# Patient Record
Sex: Male | Born: 2000 | Race: White | Marital: Single
Health system: Northeastern US, Community
[De-identification: ages and names within clinical notes are randomized; demographics above are authoritative.]

---
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
## 2020-12-06 ENCOUNTER — Emergency Department: Payer: BLUE CROSS/BLUE SHIELD

## 2020-12-06 ENCOUNTER — Other Ambulatory Visit: Payer: Self-pay

## 2020-12-06 ENCOUNTER — Emergency Department
Admission: EM | Admit: 2020-12-06 | Discharge: 2020-12-06 | Disposition: A | Discharge status: Home / Self Care | Payer: BLUE CROSS/BLUE SHIELD | Attending: Emergency Medicine | Admitting: Emergency Medicine

## 2020-12-06 DIAGNOSIS — R1310 Dysphagia, unspecified: Secondary | ICD-10-CM | POA: Diagnosis present

## 2020-12-06 DIAGNOSIS — H9201 Otalgia, right ear: Secondary | ICD-10-CM | POA: Insufficient documentation

## 2020-12-06 DIAGNOSIS — J36 Peritonsillar abscess: Secondary | ICD-10-CM

## 2020-12-06 DIAGNOSIS — D72829 Elevated white blood cell count, unspecified: Secondary | ICD-10-CM | POA: Diagnosis not present

## 2020-12-06 LAB — COMPREHENSIVE METABOLIC PANEL
ALT: 12 U/L (ref 0–44)
AST: 20 U/L (ref 15–41)
Albumin: 4.1 g/dL (ref 3.5–5.0)
Alkaline Phosphatase: 60 U/L (ref 38–126)
Anion gap: 11 (ref 5–15)
BUN: 10 mg/dL (ref 6–20)
CO2: 24 mmol/L (ref 22–32)
Calcium: 9.4 mg/dL (ref 8.9–10.3)
Chloride: 100 mmol/L (ref 98–111)
Creatinine, Ser: 0.61 mg/dL (ref 0.61–1.24)
GFR, Estimated: >60 mL/min (ref >60)
Glucose, Bld: 106 mg/dL — ABNORMAL HIGH (ref 70–99)
Potassium: 3.6 mmol/L (ref 3.5–5.1)
Sodium: 135 mmol/L (ref 135–145)
Total Bilirubin: 1 mg/dL (ref 0.3–1.2)
Total Protein: 7.7 g/dL (ref 6.5–8.1)

## 2020-12-06 LAB — CBC WITH DIFFERENTIAL/PLATELET
Abs Immature Granulocytes: 0.07 10*3/uL (ref 0.00–0.07)
Basophils Absolute: 0.1 10*3/uL (ref 0.0–0.1)
Basophils Relative: 0 %
Eosinophils Absolute: 0 10*3/uL (ref 0.0–0.5)
Eosinophils Relative: 0 %
HCT: 45.1 % (ref 39.0–52.0)
Hemoglobin: 15.1 g/dL (ref 13.0–17.0)
Immature Granulocytes: 0 %
Lymphocytes Relative: 9 %
Lymphs Abs: 1.7 10*3/uL (ref 0.7–4.0)
MCH: 29.4 pg (ref 26.0–34.0)
MCHC: 33.5 g/dL (ref 30.0–36.0)
MCV: 87.7 fL (ref 80.0–100.0)
Monocytes Absolute: 2 10*3/uL — ABNORMAL HIGH (ref 0.1–1.0)
Monocytes Relative: 10 %
Neutro Abs: 15.1 10*3/uL — ABNORMAL HIGH (ref 1.7–7.7)
Neutrophils Relative %: 81 %
Platelets: 298 10*3/uL (ref 150–400)
RBC: 5.14 MIL/uL (ref 4.22–5.81)
RDW: 12.4 % (ref 11.5–15.5)
WBC: 18.8 10*3/uL — ABNORMAL HIGH (ref 4.0–10.5)
nRBC: 0 % (ref 0.0–0.2)

## 2020-12-06 LAB — GROUP A STREP BY PCR: Group A Strep by PCR: NOT DETECTED

## 2020-12-06 MED ORDER — PREDNISONE 10 MG PO TABS: ORAL_TABLET | ORAL | 0 refills | Status: AC

## 2020-12-06 MED ORDER — SODIUM CHLORIDE 0.9 % IV BOLUS
1000.0000 mL | Freq: Once | INTRAVENOUS | Status: AC
  Administered 2020-12-06: 1000 mL via INTRAVENOUS

## 2020-12-06 MED ORDER — DEXAMETHASONE SODIUM PHOSPHATE 10 MG/ML IJ SOLN
10.0000 mg | Freq: Once | INTRAMUSCULAR | Status: AC
  Administered 2020-12-06: 10 mg via INTRAVENOUS
  Filled 2020-12-06: qty 1

## 2020-12-06 MED ORDER — ONDANSETRON HCL 4 MG/2ML IJ SOLN: 4.0000 mg | Freq: Once | INTRAMUSCULAR | Status: AC

## 2020-12-06 MED ORDER — OXYCODONE-ACETAMINOPHEN 5-325 MG PO TABS: 1.0000 | ORAL_TABLET | Freq: Four times a day (QID) | ORAL | 0 refills | Status: AC | PRN

## 2020-12-06 MED ORDER — LIDOCAINE-EPINEPHRINE 1 %-1:100000 IJ SOLN
20.0000 mL | Freq: Once | INTRAMUSCULAR | Status: AC
  Administered 2020-12-06: 1 mL via INTRADERMAL
  Filled 2020-12-06: qty 1

## 2020-12-06 MED ORDER — IOHEXOL 300 MG/ML  SOLN
75.0000 mL | Freq: Once | INTRAMUSCULAR | Status: AC | PRN
  Administered 2020-12-06: 75 mL via INTRAVENOUS
  Filled 2020-12-06: qty 75

## 2020-12-06 MED ORDER — SODIUM CHLORIDE 0.9 % IV SOLN
3.0000 g | Freq: Once | INTRAVENOUS | Status: AC
  Administered 2020-12-06: 3 g via INTRAVENOUS
  Filled 2020-12-06: qty 8

## 2020-12-06 MED ORDER — MORPHINE SULFATE (PF) 4 MG/ML IV SOLN
4.0000 mg | Freq: Once | INTRAVENOUS | Status: AC
Start: 2020-12-06 — End: 2020-12-06
  Administered 2020-12-06: 4 mg via INTRAVENOUS
  Filled 2020-12-06: qty 1

## 2020-12-06 MED ORDER — ONDANSETRON HCL 4 MG/2ML IJ SOLN
INTRAMUSCULAR | Status: AC
  Administered 2020-12-06: 4 mg via INTRAVENOUS
  Filled 2020-12-06: qty 2

## 2020-12-06 MED ORDER — AMOXICILLIN-POT CLAVULANATE 875-125 MG PO TABS: 1.0000 | ORAL_TABLET | Freq: Two times a day (BID) | ORAL | 0 refills | Status: AC

## 2020-12-06 MED ORDER — HYDROMORPHONE HCL 1 MG/ML IJ SOLN
1.0000 mg | Freq: Once | INTRAMUSCULAR | Status: AC
  Administered 2020-12-06: 1 mg via INTRAVENOUS
  Filled 2020-12-06: qty 1

## 2020-12-06 MED ORDER — BENZOCAINE 20 % MT AERO
INHALATION_SPRAY | Freq: Once | OROMUCOSAL | Status: AC
  Administered 2020-12-06: 1 via OROMUCOSAL
  Filled 2020-12-06: qty 57

## 2020-12-06 NOTE — Discharge Instructions (Signed)
Please take the antibiotics and steroid taper as prescribed.  You have also been prescribed Percocet to use every 6 hours as needed for pain.  You can combine this with an additional 650 mg of Tylenol.  Please do not drive or operate heavy machinery on this medication.  Follow-up with ENT, call their office tomorrow to schedule follow-up on Wednesday or Thursday with Dr. Andee Poles.

## 2020-12-06 NOTE — ED Triage Notes (Signed)
Pt come with c/o right ear pain for few days. Pt denies any cough. Pt states some congestion.

## 2020-12-06 NOTE — ED Notes (Signed)
Pt reports thinks he ruptured his right ear drum flying. Pt reports severe pain

## 2020-12-06 NOTE — Consult Note (Signed)
..   Dan, [name] [id number] 2000/12/10 Dan Se, MD  Reason for Consult: peritonsillar abscess  HPI: 20 y.o. male presents with several day history of right sided throat and ear pain.  Initially thought his ear drum burst but then gradually developed worsening sore throat and difficulty tolerating secretions.  Presented to ER and underwent CT scan.  Initial had difficulty with swallowing but this has improved after Decadron.  He is currently getting Unasyn.  Allergies: No Known Allergies  ROS: Review of systems normal other than 12 systems except per HPI.  PMH: History reviewed. No pertinent past medical history.  FH: No family history on file.  SH:  Social History   Socioeconomic History  . Marital status: Single    Spouse name: Not on file  . Number of children: Not on file  . Years of education: Not on file  . Highest education level: Not on file  Occupational History  . Not on file  Tobacco Use  . Smoking status: Not on file  . Smokeless tobacco: Not on file  Substance and Sexual Activity  . Alcohol use: Not on file  . Drug use: Not on file  . Sexual activity: Not on file  Other Topics Concern  . Not on file  Social History Narrative  . Not on file   Social Determinants of Health   Financial Resource Strain: Not on file  Food Insecurity: Not on file  Transportation Needs: Not on file  Physical Activity: Not on file  Stress: Not on file  Social Connections: Not on file  Intimate Partner Violence: Not on file    PSH: History reviewed. No pertinent surgical history.  Physical  Exam:  GEN-  NAD, supine in bed NEURO- CN 2-12 grossly intact and symmetric. EARS-EAC/TMs normal BL. OC/OP-   Bilateral tonsillary edema and erythema with uvular deviation to patient's left.  Fluctuance and tenderness just lateral to superior right tonsil. NECK- reactive lymphadenopathy CARD-  RRR RESP-  CTAB  CT-  2.2x1.2 right peritonsillar fluid collection  Procedure:  I&D  of Right Peritonsillar Abscess-  After verbal consent was obtained, the patient's oral cavity was anesthetized with topical Benzocaine.  0.54ml of 1% lidocaine with 1:100,000 epinephrine was injected into the patient's right peri-tonsillar region.  An 18 gauge needle was inserted just lateral to the right tonsil and approximately 4ml of purulence was removed.  A repeat aspiration did not reveal significant fluid.  The patient tolerated the procedure well.  Dispo:  Return To ER care.   A/P: Right peritonsillar abscess s/p needle I&D  Plan:  Discussed findings with patient and response to I&D.  Anticipate continued improvement and pain and oral intake.  Recommend discharge home on Augmentin and Sterapred DS 6 day taper.  I would like to see patient as follow up at [facility] on Tuesday or Wed to ensure continued resolution of issue.  He can call at [phone] to make an appointment at a time that works for his schedule.  Please contact me if any questions or concerns arise.   Bud Face 12/06/2020 9:35 PM

## 2020-12-06 NOTE — ED Notes (Signed)
Pt c/o sudden nausea and vomited. Caitlin PA notified and order given for IV zofran.

## 2020-12-06 NOTE — ED Notes (Signed)
ED Provider Dr. Funke at bedside. 

## 2020-12-06 NOTE — ED Provider Notes (Signed)
[facility] Emergency Department Provider Note  ____________________________________________   Event Date/Time   First MD Initiated Contact with Patient 12/06/20 2025     (approximate)  I have reviewed the triage vital signs and the nursing notes.   HISTORY  Chief Complaint Otalgia  HPI Dan [name] is a 20 y.o. male who presents to the emergency department for evaluation of right ear pain.  Patient states that pain began on Friday located at his right ear radiating into the right side of his neck.  He flew over the weekend to [place], and noted upon return to yesterday that the pain was very severe and he thinks that he may have ruptured the eardrum while flying.  He does report that it is quite difficult to swallow due to the pain, feels like he is having trouble swallowing his own saliva.  He also reports mild difficulty with getting a good breath in.  He denies any chest pain.  He denies any recent sick contacts, denies any known strep contacts.  He denies any fever or other illness symptoms.  Pain is rated a 10/10 and he has not had any alleviating measures.        History reviewed. No pertinent past medical history.  There are no problems to display for this patient.   History reviewed. No pertinent surgical history.  Prior to Admission medications   Medication Sig Start Date End Date Taking? Authorizing Provider  amoxicillin-clavulanate (AUGMENTIN) 875-125 MG tablet Take 1 tablet by mouth every 12 (twelve) hours for 10 days. 12/06/20 12/16/20 Yes Lorretta Kerce, Ruben Gottron, PA  oxyCODONE-acetaminophen (PERCOCET) 5-325 MG tablet Take 1 tablet by mouth every 6 (six) hours as needed for up to 3 days for severe pain. 12/06/20 12/09/20 Yes Eldana Isip, Ruben Gottron, PA  predniSONE (DELTASONE) 10 MG tablet Take 6 tablets (60 mg total) by mouth daily for 1 day, THEN 5 tablets (50 mg total) daily for 1 day, THEN 4 tablets (40 mg total) daily for 1 day, THEN 3 tablets (30 mg  total) daily for 1 day, THEN 2 tablets (20 mg total) daily for 1 day, THEN 1 tablet (10 mg total) daily for 1 day. 12/06/20 12/12/20 Yes Lucy Chris, PA    Allergies Patient has no known allergies.  No family history on file.  Social History    Review of Systems Constitutional: No fever/chills Eyes: No visual changes. ENT: + Right ear pain, + sore throat, + difficulty swallowing Cardiovascular: Denies chest pain. Respiratory: Denies shortness of breath. Gastrointestinal: No abdominal pain.  No nausea, no vomiting.  No diarrhea.  No constipation. Genitourinary: Negative for dysuria. Musculoskeletal: Negative for back pain. Skin: Negative for rash. Neurological: Negative for headaches, focal weakness or numbness.  ____________________________________________   PHYSICAL EXAM:  VITAL SIGNS: ED Triage Vitals  Enc Vitals Group     BP 12/06/20 1717 124/73     Pulse Rate 12/06/20 1717 80     Resp 12/06/20 1717 18     Temp 12/06/20 1717 98.6 F (37 C)     Temp Source 12/06/20 1717 Oral     SpO2 12/06/20 1717 100 %     Weight 12/06/20 1715 149 lb (67.6 kg)     Height 12/06/20 1715 5\' 9"  (1.753 m)     Head Circumference --      Peak Flow --      Pain Score 12/06/20 1715 5     Pain Loc --      Pain Edu? --  Excl. in GC? --    Constitutional: Alert and oriented.  In mild distress. Eyes: Conjunctivae are normal. PERRL. EOMI. Head: Atraumatic. Nose: No congestion/rhinnorhea. Mouth/Throat: There is significant enlargement of the bilateral tonsils, worse on the right than the left.  Given the size of the right tonsil, there is a slight uvular shift.  There are exudates on the bilateral tonsils.  Patient has difficulty opening his mouth all of the way given the amount of pain that he is in.  He is having trouble managing his secretions and is beginning to spit rather than swallow his saliva. Ears: The bilateral TMs are visualized, pearly gray with no erythema or  bulging. Neck: No stridor.   Lymphatic: Bilateral cervical lymphadenopathy present Cardiovascular: Normal rate, regular rhythm. Grossly normal heart sounds.  Good peripheral circulation. Respiratory: Normal respiratory effort.  No retractions. Lungs CTAB. Gastrointestinal: Soft and nontender. No distention. No abdominal bruits. No CVA tenderness. Musculoskeletal: No lower extremity tenderness nor edema.  No joint effusions. Neurologic:  Normal speech and language. No gross focal neurologic deficits are appreciated. No gait instability. Skin:  Skin is warm, dry and intact. No rash noted. Psychiatric: Mood and affect are normal. Speech and behavior are normal.  ____________________________________________   LABS (all labs ordered are listed, but only abnormal results are displayed)  Labs Reviewed  COMPREHENSIVE METABOLIC PANEL - Abnormal; Notable for the following components:      Result Value   Glucose, Bld 106 (*)    All other components within normal limits  CBC WITH DIFFERENTIAL/PLATELET - Abnormal; Notable for the following components:   WBC 18.8 (*)    Neutro Abs 15.1 (*)    Monocytes Absolute 2.0 (*)    All other components within normal limits  GROUP A STREP BY PCR  AEROBIC/ANAEROBIC CULTURE W GRAM STAIN (SURGICAL/DEEP WOUND)   ____________________________________________  RADIOLOGY  Official radiology report(s): CT Soft Tissue Neck W Contrast  Result Date: 12/06/2020 CLINICAL DATA:  Peritonsillar abscess EXAM: CT NECK WITH CONTRAST TECHNIQUE: Multidetector CT imaging of the neck was performed using the standard protocol following the bolus administration of intravenous contrast. CONTRAST:  50mL OMNIPAQUE IOHEXOL 300 MG/ML  SOLN COMPARISON:  None. FINDINGS: PHARYNX AND LARYNX: Right palatine peritonsillar collection measures 2.2 x 1.2 cm. Both palatine tonsils are edematous. Mild enlargement of the adenoid and lingual tonsils. SALIVARY GLANDS: Normal parotid, submandibular  and sublingual glands. THYROID: Normal. LYMPH NODES: Bilateral reactive enlarged lymph nodes. VASCULAR: Major cervical vessels are patent. LIMITED INTRACRANIAL: Normal. VISUALIZED ORBITS: Normal. MASTOIDS AND VISUALIZED PARANASAL SINUSES: No fluid levels or advanced mucosal thickening. No mastoid effusion. SKELETON: No bony spinal canal stenosis. No lytic or blastic lesions. UPPER CHEST: Clear. OTHER: None. IMPRESSION: 1. Acute tonsillopharyngitis with right palatine peritonsillar collection measuring 2.2 x 1.2 cm. 2. Reactive cervical lymphadenopathy. Electronically Signed   By: Deatra Robinson M.D.   On: 12/06/2020 19:50   ____________________________________________   INITIAL IMPRESSION / ASSESSMENT AND PLAN / ED COURSE  As part of my medical decision making, I reviewed the following data within the electronic MEDICAL RECORD NUMBER Nursing notes reviewed and incorporated, Labs reviewed, A consult was requested and obtained from this/these consultant(s) ENT, Evaluated by EM attending Dr. 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  See HPI for further details.  In triage, the  patient is not febrile or tachycardic.  On physical exam, he does appear in mild distress over the amount of pain and difficulties having swallowing.  Physical exam of the oropharynx reveals significant erythema and exudates on the bilateral tonsils with significantly more enlargement of the right tonsil compared to left.  There is a slight uvular shift off of midline.  He is also having difficulty managing his secretions due to the pain on swallowing.  Given his physical exam, have a high suspicion for PTA.  Will obtain strep swab, CBC, CMP as well as CT with contrast.  The patient does have a leukocytosis of 18.8 with a left shift, otherwise labs are unremarkable.  CT does  demonstrate tonsillopharyngitis with a 2 cm fluid collection.  Given these findings, ENT was consulted on the case who reviewed the images and agreed to come evaluate the patient.  In the interim, patient was given pain medication, IV Decadron and IV Unasyn.  After ENT consultation, he recommends bedside drainage.  Medications were ordered per his request for HurriCaine spray lidocaine.  After drainage, this was sent for aerobic and anaerobic culture.  Patient will be discharged on oral prednisone taper as well as Augmentin per the recommendation of ENT.  Return precautions were discussed and he is stable this time for outpatient follow-up.  ENT recommended follow-up on Wednesday or Thursday.  Dr. Alfred Levins also came and personally evaluated the patient.      ____________________________________________   FINAL CLINICAL IMPRESSION(S) / ED DIAGNOSES  Final diagnoses:  Peritonsillar abscess     ED Discharge Orders         Ordered    amoxicillin-clavulanate (AUGMENTIN) 875-125 MG tablet  Every 12 hours        12/06/20 2150    predniSONE (DELTASONE) 10 MG tablet        12/06/20 2150    oxyCODONE-acetaminophen (PERCOCET) 5-325 MG tablet  Every 6 hours PRN        12/06/20 2153          *Please note:  Dan [name] was evaluated in Emergency Department on 12/07/2020 for the symptoms described in the history of present illness. He was evaluated in the context of the global COVID-19 pandemic, which necessitated consideration that the patient might be at risk for infection with the SARS-CoV-2 virus that causes COVID-19. Institutional protocols and algorithms that pertain to the evaluation of patients at risk for COVID-19 are in a state of rapid change based on information released by regulatory bodies including the CDC and federal and state organizations. These policies and algorithms were followed during the patient's care in the ED.  Some ED evaluations and interventions may be delayed as a result of  limited staffing during and the pandemic.*   Note:  This document was prepared using Dragon voice recognition software and may include unintentional dictation errors.   Lucy Chris, PA 12/07/20 0012    [name], MD 12/08/20 [phone]

## 2020-12-10 LAB — AEROBIC/ANAEROBIC CULTURE W GRAM STAIN (SURGICAL/DEEP WOUND)

## 2020-12-11 NOTE — Consult Note (Addendum)
Messaged Dr. Andee Poles the culture results, to see antibiotics needs to be adjusted. I also called the office and faxed over the culture results to Dr. Gregary Cromer staff. Mr Caster, has an appointment with Dr. Andee Poles today.   Thanks,   Paschal Dopp, PharmD, BCPS.

## 2021-01-02 ENCOUNTER — Emergency Department
Admission: EM | Admit: 2021-01-02 | Discharge: 2021-01-02 | Disposition: A | Discharge status: Home / Self Care | Payer: BC Managed Care – PPO | Attending: Emergency Medicine | Admitting: Emergency Medicine

## 2021-01-02 DIAGNOSIS — J029 Acute pharyngitis, unspecified: Secondary | ICD-10-CM | POA: Diagnosis not present

## 2021-01-02 DIAGNOSIS — R070 Pain in throat: Secondary | ICD-10-CM | POA: Diagnosis not present

## 2021-01-02 DIAGNOSIS — J039 Acute tonsillitis, unspecified: Secondary | ICD-10-CM | POA: Insufficient documentation

## 2021-01-02 LAB — CBC, PLATELET & DIFFERENTIAL
ABSOLUTE BASOPHIL COUNT MANUAL: 0 10*3/uL (ref 0.0–0.1)
ABSOLUTE EO COUNT MANUAL: 0.2 10*3/uL (ref 0.0–0.8)
ABSOLUTE LYMPH COUNT MANUAL: 4.9 10*3/uL (ref 0.6–5.9)
ABSOLUTE METAMYELOCYTE CT MAN: 0.1 10*3/uL — ABNORMAL HIGH (ref 0.0–0.0)
ABSOLUTE MONOCYTE COUNT MANUAL: 0.7 10*3/uL (ref 0.2–1.4)
ABSOLUTE MYELOCYTE COUNT MAN: 0.1 10*3/uL — ABNORMAL HIGH (ref 0.0–0.0)
ABSOLUTE NEUT COUNT MANUAL: 2.5 THuL (ref 1.6–8.3)
ABSOLUTE NRBC COUNT: 0 10*3/uL (ref 0.0–0.0)
ATYPICAL LYMPHOCYTES %: 8 % (ref 0.0–12.0)
BAND NEUTROPHILS %: 6 % (ref 0.0–8.0)
BASOPHILS %: 0 % (ref 0.0–1.2)
EOSINOPHILS %: 2 % (ref 0.0–7.0)
HEMATOCRIT: 46.5 % (ref 40.1–51.0)
HEMOGLOBIN: 15.4 g/dL (ref 13.7–17.5)
LYMPHOCYTES %: 50 % (ref 15.0–54.0)
MEAN CORP HGB CONC: 33.1 g/dL (ref 31.0–37.0)
MEAN CORPUSCULAR HGB: 29.3 pg (ref 26.0–34.0)
MEAN CORPUSCULAR VOL: 88.6 fl (ref 80.0–100.0)
MEAN PLATELET VOLUME: 9.7 fL (ref 8.7–12.5)
METAMYELOCYTES %: 1 % — ABNORMAL HIGH (ref 0–0.0)
MONOCYTES %: 8 % (ref 4.0–13.0)
MYELOCYTES %: 1 % — ABNORMAL HIGH (ref 0–0.0)
NRBC %: 0 % (ref 0.0–0.0)
NUCLEATED RED BLOOD CELLS: 0 /100 WC (ref 0.0–0.0)
PLATELET COUNT: 253 10*3/uL (ref 150–400)
PLATELET ESTIMATE: NORMAL
POLYMORPHONUCLEAR (SEGS) %: 24 % — ABNORMAL LOW (ref 40.0–75.0)
RBC DISTRIBUTION WIDTH STD DEV: 41.6 fL (ref 35.1–46.3)
RED BLOOD CELL COUNT: 5.25 M/uL (ref 4.60–6.10)
WHITE BLOOD CELL COUNT: 8.4 10*3/uL (ref 4.0–11.0)

## 2021-01-02 LAB — BASIC METABOLIC PANEL
ANION GAP: 13 mmol/L (ref 10–22)
BUN (UREA NITROGEN): 16 mg/dL (ref 7–18)
CALCIUM: 9.8 mg/dl (ref 8.5–10.1)
CARBON DIOXIDE: 23 mmol/L (ref 21–32)
CHLORIDE: 101 mmol/L (ref 98–107)
CREATININE: 0.8 mg/dL (ref 0.7–1.2)
ESTIMATED GLOMERULAR FILT RATE: >60 mL/min (ref >60)
Glucose Random: 87 mg/dL (ref 74–160)
POTASSIUM: 4.3 mmol/L (ref 3.5–5.1)
SODIUM: 137 mmol/L (ref 136–145)

## 2021-01-02 MED ORDER — AMOXICILLIN-POT CLAVULANATE 875-125 MG PO TABS
1.00 | ORAL_TABLET | Freq: Two times a day (BID) | ORAL | 0 refills | Status: AC
Start: 2021-01-02 — End: 2021-01-09

## 2021-01-02 MED ORDER — DEXAMETHASONE SOD PHOSPHATE 10 MG/ML IJ SOLN (SUPER ERX)
10.00 mg | Freq: Once | Status: AC
Start: 2021-01-02 — End: 2021-01-02
  Administered 2021-01-02: 10 mg via INTRAVENOUS
  Filled 2021-01-02: qty 1

## 2021-01-02 MED ORDER — PREDNISONE 20 MG PO TABS
40.00 mg | ORAL_TABLET | Freq: Every day | ORAL | 0 refills | Status: AC
Start: 2021-01-02 — End: 2021-01-07

## 2021-01-02 NOTE — Discharge Instructions (Addendum)
Please take new medications as directed and follow discharge instructions provided.  Please make sure to follow-up with your primary care provider and ENT in 3 days to discuss today's emergency department visit and for further evaluation and management.  Please return the emergency department immediately for reevaluation if your symptoms worsen or you develop any additional concerning symptoms.

## 2021-01-02 NOTE — Narrator Note (Signed)
Patient Disposition  Patient education for diagnosis, medications, activity, diet and follow-up.  Patient left ED 7:53 PM.  Patient rep received written instructions.    Interpreter to provide instructions: No    Patient belongings with patient: YES    Have all existing LDAs been addressed? YES  Have all IV infusions been stopped? N/A    Destination: Discharged to home with scripts. Verbal and written instructions given. Patient understands plan of care and reasons for returning to the ED. Encouraged patient to follow up with PCP and ENT.

## 2021-01-02 NOTE — ED Provider Notes (Signed)
Patient seen and evaluated with the PA/resident. Please see their ED Provider Note for additional details.    Subjective:   Sore throat. Had PTA drained 1 month ago, completed ABx and steroids. Now feeling some pain and swelling in the throat again.     Objective:  Posterior pharynx is red, but uvula is midline. Voice sounds normal. Able to fully open mouth.    Assessment/Plan:  Doesn't appear to have a PTA again today, as symptoms just started yesterday again. Given very recent PTA, will treat with antibiotic out of abundance of caution. Follow up with PCP.     Olam Idler, MD  Attending Physician  [facility] Department of Emergency Medicine

## 2021-01-02 NOTE — ED Triage Note (Signed)
Pt had right sided peritonsillar abscess drained on April 22nd and then the 27th. Pt finished a course of antibiotics but states it is still bothering him. Last ibuprofen at 130pm.

## 2021-01-06 ENCOUNTER — Encounter (HOSPITAL_BASED_OUTPATIENT_CLINIC_OR_DEPARTMENT_OTHER): Payer: Self-pay | Admitting: Otolaryngology

## 2021-01-06 NOTE — ED Provider Notes (Signed)
EMERGENCY DEPARTMENT Physician Assistant NOTE    The ED nursing record was reviewed.   The prior medical records as available electronically through Epic were reviewed.  This patient was seen with Emergency Department attending physician Dr. Junious Silk    CHIEF COMPLAINT    Patient presents with:  Abscess      HPI    HPI   Kalani Mirsky is a 20 year old male patient presenting to the ED for evaluation of sore throat.  Patient reports he had a peritonsillar  abscess drain a month ago while he was away at college in [place].  Completed course of antibiotics and steroids.  Patient reports yesterday he started to develop some throat discomfort and that he believes his throat looks swollen.  Denies any fevers, chills, body aches, runny nose, cough.  No pertinent past medical history.  No daily meds.  Immunizations up-to-date    REVIEW OF SYSTEMS    Review of Systems   Constitutional: Negative for chills, fever and malaise/fatigue.   HENT: Positive for sore throat. Negative for congestion and ear pain.    Eyes: Negative for blurred vision and pain.   Respiratory: Negative for cough, hemoptysis, shortness of breath, wheezing and stridor.    Cardiovascular: Negative for chest pain, palpitations, orthopnea and leg swelling.   Gastrointestinal: Negative for abdominal pain, constipation, diarrhea, nausea and vomiting.   Genitourinary: Negative for dysuria, flank pain, frequency and hematuria.   Musculoskeletal: Negative for back pain, falls, myalgias and neck pain.   Skin: Negative for itching and rash.   Neurological: Negative for dizziness, loss of consciousness, weakness and headaches.   Endo/Heme/Allergies: Negative for polydipsia. Does not bruise/bleed easily.   Psychiatric/Behavioral: Negative for depression, substance abuse and suicidal ideas.      The pertinent positives are reviewed in the HPI above. All other systems were reviewed and are negative.    PAST MEDICAL HISTORY    No past medical history on  file.    PROBLEM LIST  There is no problem list on file for this patient.      SURGICAL HISTORY    No past surgical history on file.    CURRENT MEDICATIONS    No current facility-administered medications for this encounter.    Current Outpatient Medications:     predniSONE (DELTASONE) 20 MG tablet, Take 2 tablets by mouth daily  for 5 days, Disp: 10 tablet, Rfl: 0    amoxicillin-clavulanate (AUGMENTIN) 875-125 MG per tablet, Take 1 tablet by mouth 2 (two) times daily  for 7 days, Disp: 14 tablet, Rfl: 0    ALLERGIES    Review of Patient's Allergies indicates:  No Known Allergies    FAMILY HISTORY    No family history on file.      SOCIAL HISTORY    Social History     Socioeconomic History    Marital status: Single     Spouse name: Not on file    Number of children: Not on file    Years of education: Not on file    Highest education level: Not on file   Occupational History    Not on file   Tobacco Use    Smoking status: Not on file    Smokeless tobacco: Not on file   Substance and Sexual Activity    Alcohol use: Not on file    Drug use: Not on file    Sexual activity: Not on file   Other Topics Concern    Not on file  Social History Narrative    Not on file   Social Determinants of Health  Financial Resource Strain: Not on file  Food Insecurity: Not on file  Transportation Needs: Not on file  Physical Activity: Not on file  Stress: Not on file  Social Connections: Not on file  Intimate Partner Violence: Not on file  Housing Stability: Not on file      PHYSICAL EXAM      Vital Signs: BP 115/65    Pulse 65    Temp 98.1 F (Temporal)    Resp 15    SpO2 99%    Physical Exam  Vitals and nursing note reviewed.   Constitutional:       General: He is awake. He is not in acute distress.     Appearance: Normal appearance. He is well-developed. He is not ill-appearing, toxic-appearing or diaphoretic.   HENT:      Head: Normocephalic and atraumatic.      Right Ear: Tympanic membrane and external ear normal.       Left Ear: Tympanic membrane and external ear normal.      Nose: Nose normal.      Mouth/Throat:      Mouth: Mucous membranes are moist.      Pharynx: Oropharynx is clear. Uvula midline. Posterior oropharyngeal erythema present. No pharyngeal swelling, oropharyngeal exudate or uvula swelling.      Tonsils: No tonsillar exudate or tonsillar abscesses.   Eyes:      Extraocular Movements: Extraocular movements intact.      Pupils: Pupils are equal, round, and reactive to light.   Cardiovascular:      Rate and Rhythm: Normal rate and regular rhythm.      Pulses: Normal pulses.      Heart sounds: Normal heart sounds.   Pulmonary:      Effort: Pulmonary effort is normal. No tachypnea, accessory muscle usage or respiratory distress.      Breath sounds: Normal breath sounds and air entry. No stridor.   Abdominal:      General: Bowel sounds are normal. There is no distension.      Palpations: Abdomen is soft.      Tenderness: There is no abdominal tenderness.   Musculoskeletal:         General: Normal range of motion.   Skin:     General: Skin is warm and dry.      Capillary Refill: Capillary refill takes less than 2 seconds.   Neurological:      General: No focal deficit present.      Mental Status: He is alert and oriented to person, place, and time.   Psychiatric:         Mood and Affect: Mood normal.         Behavior: Behavior normal. Behavior is cooperative.              RESULTS  No results found for this visit on 01/02/21 (from the past 24 hour(s)).         MEDICATIONS ADMINISTERED ON THIS VISIT  Orders Placed This Encounter      dexamethasone (DECADRON) injection 10 mg      predniSONE (DELTASONE) 20 MG tablet          Sig: Take 2 tablets by mouth daily  for 5 days          Dispense:  10 tablet          Refill:  0  amoxicillin-clavulanate (AUGMENTIN) 875-125 MG per tablet          Sig: Take 1 tablet by mouth 2 (two) times daily  for 7 days          Dispense:  14 tablet          Refill:  0      ED COURSE & MEDICAL  DECISION MAKING      I reviewed the patient's past medical history/problem list, past surgical history, medication list, social history and allergies.    ED Decision Making & Course: Pt is a 20 year old male patient presenting to the ED for evaluation of sore throat.  Hemodynamically stable.  Nontoxic and in no acute distress.  Patient is overall well-appearing, pleasant, conversational.  No peritonsillar abscess visualized.  No stridor.  Patient managing secretions without issue.  No hot potato voice.  Lungs CTA in all lobes.  Patient does have enlarged tonsils bilaterally and some erythema was appreciated but no exudate or abscess.  Due to recent peritonsillar abscess being drained we will start on a course of Augmentin and steroids and have him follow-up closely with primary care and ENT.  Patient and patient's father verbalized understanding and are agreeable with plan.  Strict ED return precautions given for any worsening or additional concerning symptoms.  Pt remained hemodynamically stable during their stay in the emergency department.     Follow Up: PCP, ENT    Clinical Impression:  Tonsillitis  Sore throat    Disposition:   Discharge    Patient Condition:  Stable

## 2022-10-27 IMAGING — CT CT NECK W/ CM
3 of 4 series · 11 of 35 positions shown, 13 images · IV contrast (omnipaque)
Comparison: None.

CLINICAL DATA: Peritonsillar abscess

EXAM:
CT NECK WITH CONTRAST
TECHNIQUE: Multidetector CT imaging of the neck was performed using the
standard protocol following the bolus administration of intravenous
contrast.
CONTRAST:  75mL OMNIPAQUE IOHEXOL 300 MG/ML  SOLN

[Series 5: sag neck · sagittal · 0.44mm/px · 5 of 102 slices shown, 6 images]
[im 34/102  bone]
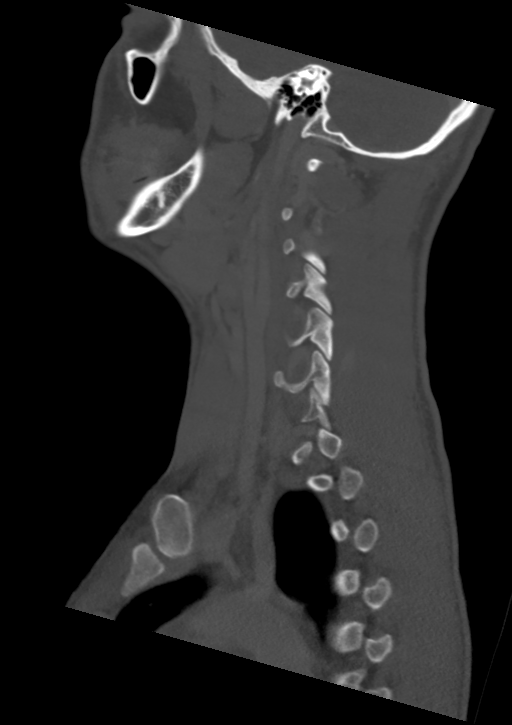
[im 43/102  bone]
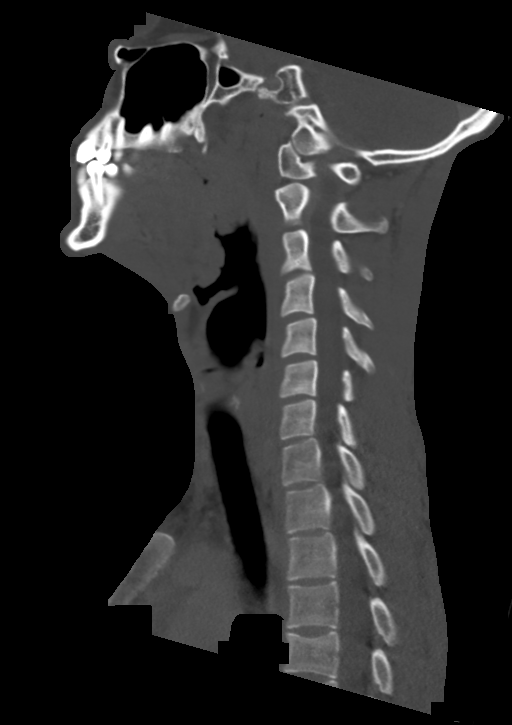
[im 51/102  soft-tissue]
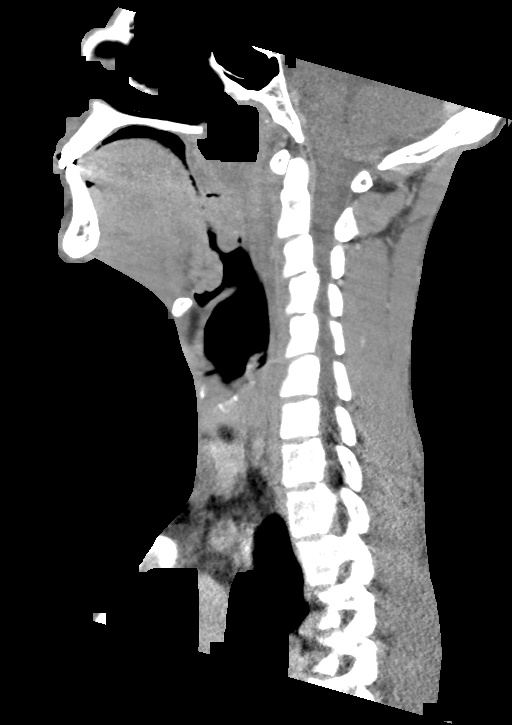
[im 51/102  bone]
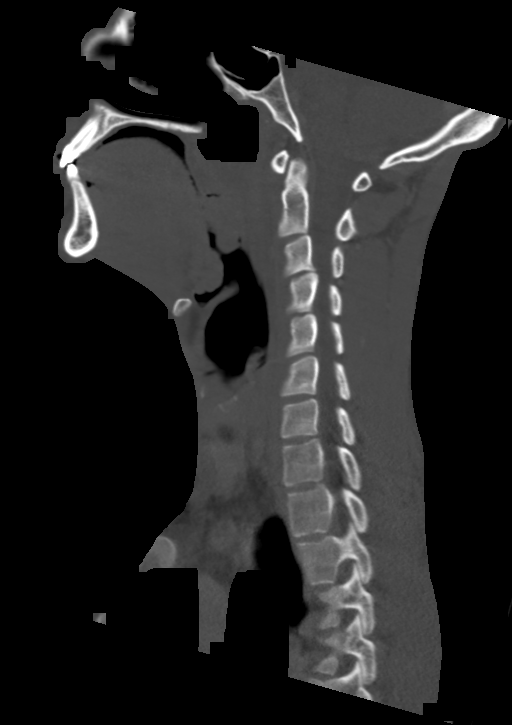
[im 59/102  bone]
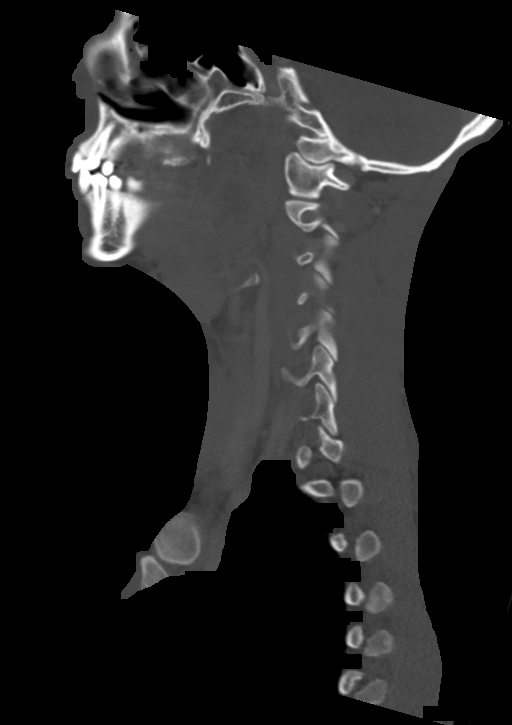
[im 68/102  bone]
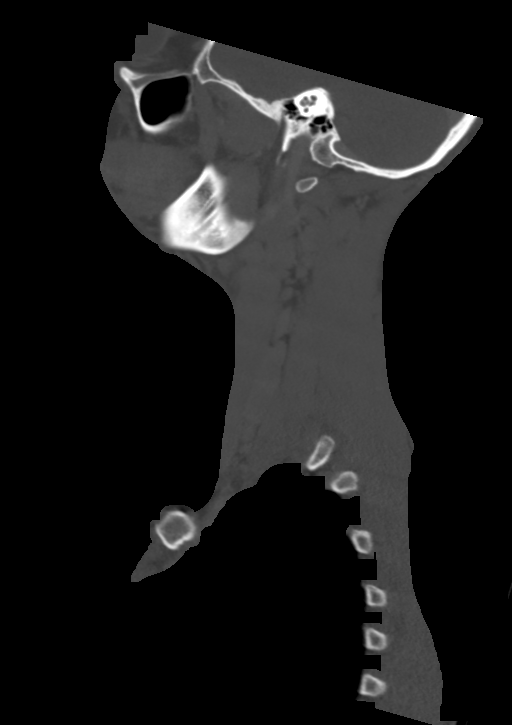

[Series 6: cor neck · coronal · 0.34mm/px · 3 of 108 slices shown]
[im 24/108  bone]
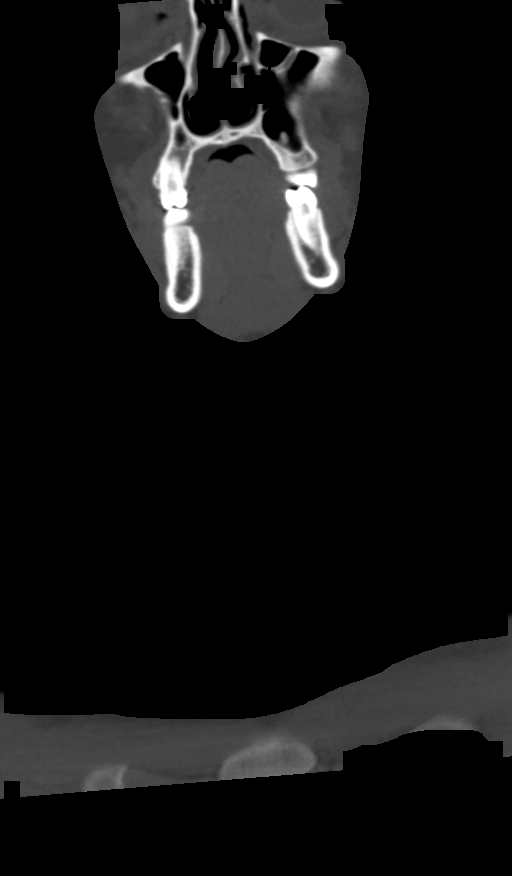
[im 44/108  bone]
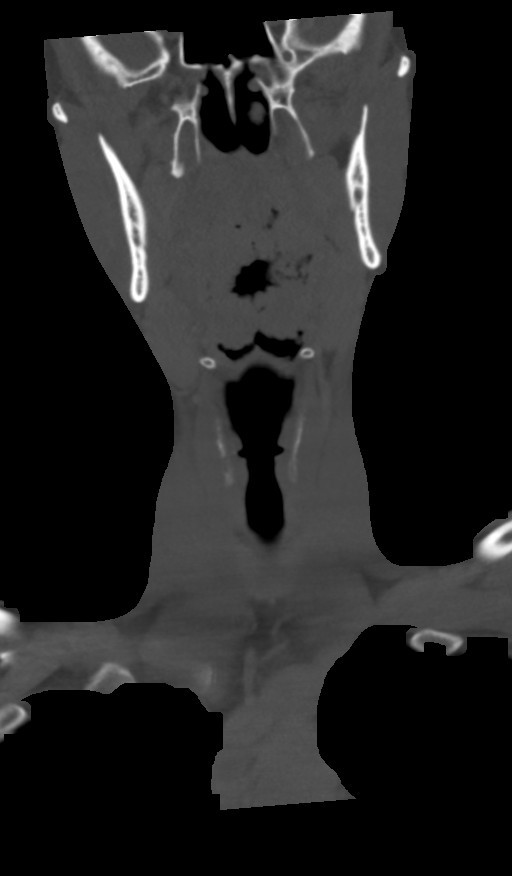
[im 64/108  bone]
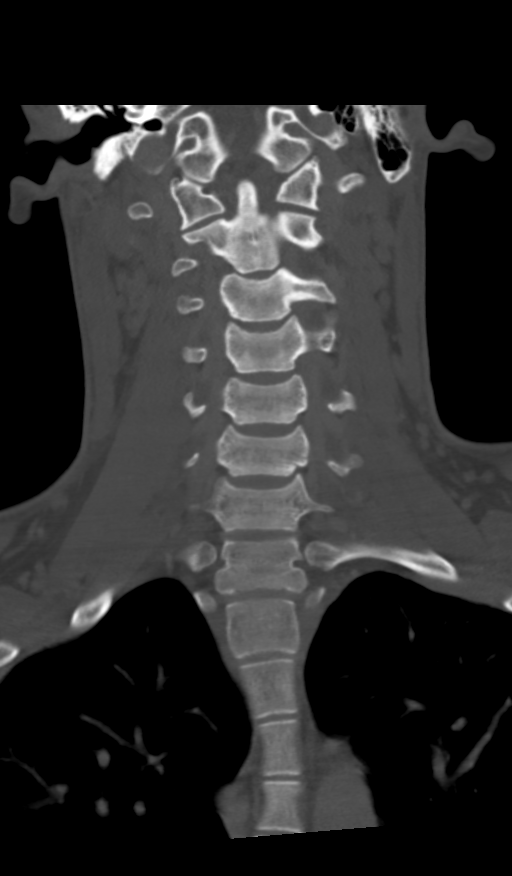

[Series 7: orthogonal ax · axial · 0.35mm/px · z∈[+129,+314]mm · 3 of 145 slices shown, 4 images]
[im 25/145  soft-tissue]
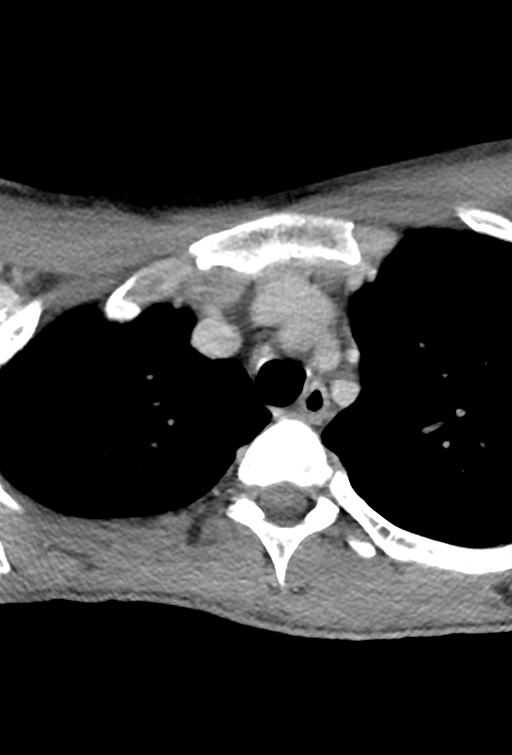
[im 25/145  bone]
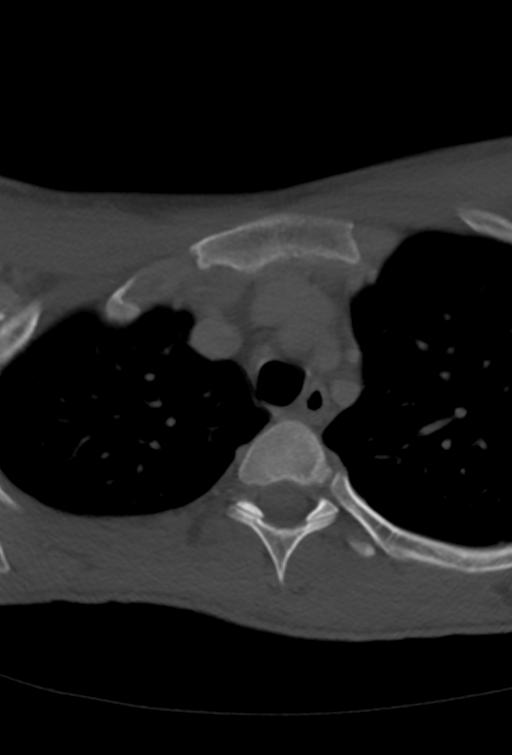
[im 73/145  bone]
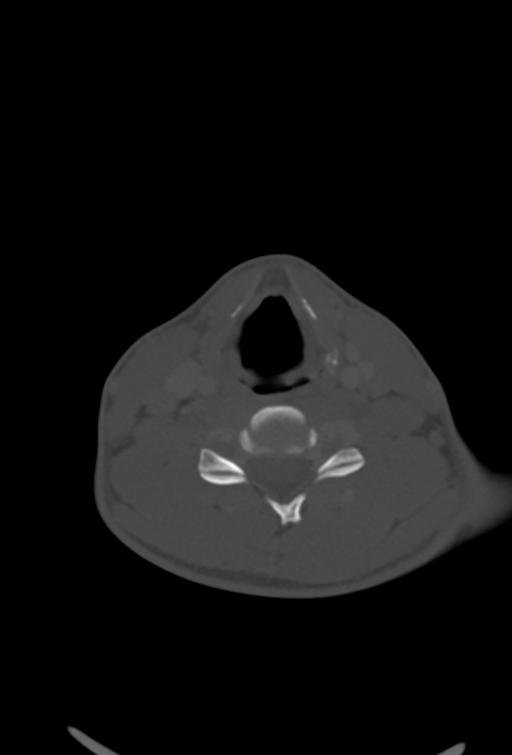
[im 121/145  bone]
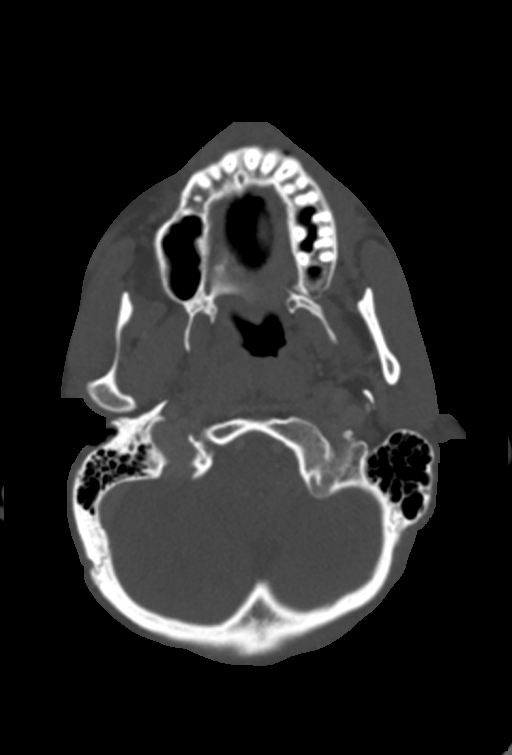

[11 of 35 positions shown; findings below may reference images not displayed]

FINDINGS: PHARYNX AND LARYNX: Right palatine peritonsillar collection measures
2.2 x 1.2 cm. Both palatine tonsils are edematous. Mild enlargement
of the adenoid and lingual tonsils.

SALIVARY GLANDS: Normal parotid, submandibular and sublingual
glands.

THYROID: Normal.

LYMPH NODES: Bilateral reactive enlarged lymph nodes.

VASCULAR: Major cervical vessels are patent.

LIMITED INTRACRANIAL: Normal.

VISUALIZED ORBITS: Normal.

MASTOIDS AND VISUALIZED PARANASAL SINUSES: No fluid levels or
advanced mucosal thickening. No mastoid effusion.

SKELETON: No bony spinal canal stenosis. No lytic or blastic
lesions.

UPPER CHEST: Clear.

OTHER: None.
IMPRESSION: 1. Acute tonsillopharyngitis with right palatine peritonsillar
collection measuring 2.2 x 1.2 cm.
2. Reactive cervical lymphadenopathy.
